# Patient Record
Sex: Female | Born: 1995 | Race: White | Hispanic: No | Marital: Single | Smoking: Never smoker
Health system: Southern US, Community
[De-identification: ages and names within clinical notes are randomized; demographics above are authoritative.]

## PROBLEM LIST (undated history)

## (undated) HISTORY — PX: APPENDECTOMY: SHX54

---

## 2022-06-04 ENCOUNTER — Emergency Department
Admission: EM | Admit: 2022-06-04 | Discharge: 2022-06-04 | Disposition: A | Discharge status: Home / Self Care | Payer: Self-pay | Attending: Emergency Medicine | Admitting: Emergency Medicine

## 2022-06-04 ENCOUNTER — Encounter: Payer: Self-pay | Admitting: Emergency Medicine

## 2022-06-04 ENCOUNTER — Emergency Department: Payer: Self-pay

## 2022-06-04 ENCOUNTER — Other Ambulatory Visit: Payer: Self-pay

## 2022-06-04 DIAGNOSIS — X501XXA Overexertion from prolonged static or awkward postures, initial encounter: Secondary | ICD-10-CM | POA: Insufficient documentation

## 2022-06-04 DIAGNOSIS — S93402A Sprain of unspecified ligament of left ankle, initial encounter: Secondary | ICD-10-CM | POA: Insufficient documentation

## 2022-06-04 DIAGNOSIS — Y9364 Activity, baseball: Secondary | ICD-10-CM | POA: Insufficient documentation

## 2022-06-04 MED ORDER — HYDROCODONE-ACETAMINOPHEN 5-325 MG PO TABS: 1.0000 | ORAL_TABLET | Freq: Four times a day (QID) | ORAL | 0 refills | Status: AC | PRN

## 2022-06-04 MED ORDER — HYDROCODONE-ACETAMINOPHEN 5-325 MG PO TABS
1.0000 | ORAL_TABLET | Freq: Once | ORAL | Status: AC
  Administered 2022-06-04: 1 via ORAL
  Filled 2022-06-04: qty 1

## 2022-06-04 NOTE — Discharge Instructions (Addendum)
Follow-up with orthopedist of your choice when you get back home if any continued problems.  Ice and elevation to reduce swelling and help with pain.  Also use your crutches until you are able to bear weight without any pain.  Wear the ankle splint for support and protection.  A prescription for pain medication was sent to your pharmacy to take as needed.  Do not drive or operate machinery while taking this medication as it could cause drowsiness and increase your risk for fall.

## 2022-06-04 NOTE — ED Notes (Signed)
See triage note  Presents with injury to left ankle  States hurt her ankle playing softball  Jumped and landed wrong

## 2022-06-04 NOTE — ED Triage Notes (Signed)
Pt via POV from home. Pt was playing softball and jumped up and twisted her L ankle today. Pt is A&OX4 and NAD. Unable to bear weight.

## 2022-06-04 NOTE — ED Provider Notes (Signed)
[facility] Provider Note    Event Date/Time   First MD Initiated Contact with Patient 06/04/22 1222     (approximate)   History   Ankle Pain   HPI  Jo Gonzales is a 26 y.o. female   presents to the ED with complaint of left ankle pain.  Patient states she was playing softball and jumped up landing on her ankle wrong and heard a couple of pops.  Since that time she is been unable to bear weight without pain and has some swelling.  Patient denies any other symptoms or injuries.      Physical Exam   Triage Vital Signs: ED Triage Vitals  Enc Vitals Group     BP 06/04/22 1204 116/80     Pulse Rate 06/04/22 1204 70     Resp 06/04/22 1204 18     Temp 06/04/22 1204 98.5 F (36.9 C)     Temp Source 06/04/22 1204 Oral     SpO2 06/04/22 1204 98 %     Weight 06/04/22 1201 190 lb (86.2 kg)     Height 06/04/22 1201 5\' 9"  (1.753 m)     Head Circumference --      Peak Flow --      Pain Score 06/04/22 1201 9     Pain Loc --      Pain Edu? --      Excl. in GC? --     Most recent vital signs: Vitals:   06/04/22 1204  BP: 116/80  Pulse: 70  Resp: 18  Temp: 98.5 F (36.9 C)  SpO2: 98%     General: Awake, no distress.  CV:  Good peripheral perfusion.  Resp:  Normal effort.  Abd:  No distention.  Other:  Examination of left ankle there is some soft tissue edema noted on the lateral malleolus but no gross deformity is noted.  Pulses present and motor or sensory function intact.  Skin is intact no discoloration at this time.   ED Results / Procedures / Treatments   Labs (all labs ordered are listed, but only abnormal results are displayed) Labs Reviewed - No data to display   RADIOLOGY Images of the left ankle were reviewed and interpreted by me with no fracture noted.  Radiology report is negative for acute bony injury.    PROCEDURES:  Critical Care performed:   Procedures   MEDICATIONS ORDERED IN ED: Medications   HYDROcodone-acetaminophen (NORCO/VICODIN) 5-325 MG per tablet 1 tablet (has no administration in time range)     IMPRESSION / MDM / ASSESSMENT AND PLAN / ED COURSE  I reviewed the triage vital signs and the nursing notes.   Differential diagnosis includes, but is not limited to, fractured left ankle, sprain left ankle, dislocation.  26 year old female presents to the ED with an injury to her left ankle while playing softball today.  Patient has some swelling and has been unable to bear weight since that time.  X-rays were reassuring and negative for any acute bony injury.  Patient was made aware that she would need to use crutches for walking which she has at home.  A ankle splint was applied and patient received Norco while in the emergency department for pain.  Patient is instructed to ice and elevate as needed and to use her crutches until she is able to bear weight without pain.  She is to follow-up with her orthopedist or PCP when she gets back home to [facility].  Patient's presentation is most consistent with acute complicated illness / injury requiring diagnostic workup.  FINAL CLINICAL IMPRESSION(S) / ED DIAGNOSES   Final diagnoses:  Sprain of left ankle, unspecified ligament, initial encounter     Rx / DC Orders   ED Discharge Orders          Ordered    HYDROcodone-acetaminophen (NORCO/VICODIN) 5-325 MG tablet  Every 6 hours PRN        06/04/22 1342             Note:  This document was prepared using Dragon voice recognition software and may include unintentional dictation errors.   Tommi Rumps, PA-C 06/04/22 1350    Sharman Cheek, MD 06/04/22 1934

## 2023-07-10 IMAGING — DX DG ANKLE COMPLETE 3+V*L*
3 series · 3 of 3 positions shown · non-contrast
Comparison: None Available.

CLINICAL DATA: Ankle injury.  Unable to bear weight.

EXAM:
LEFT ANKLE COMPLETE - 3+ VIEW

[ankle ap]
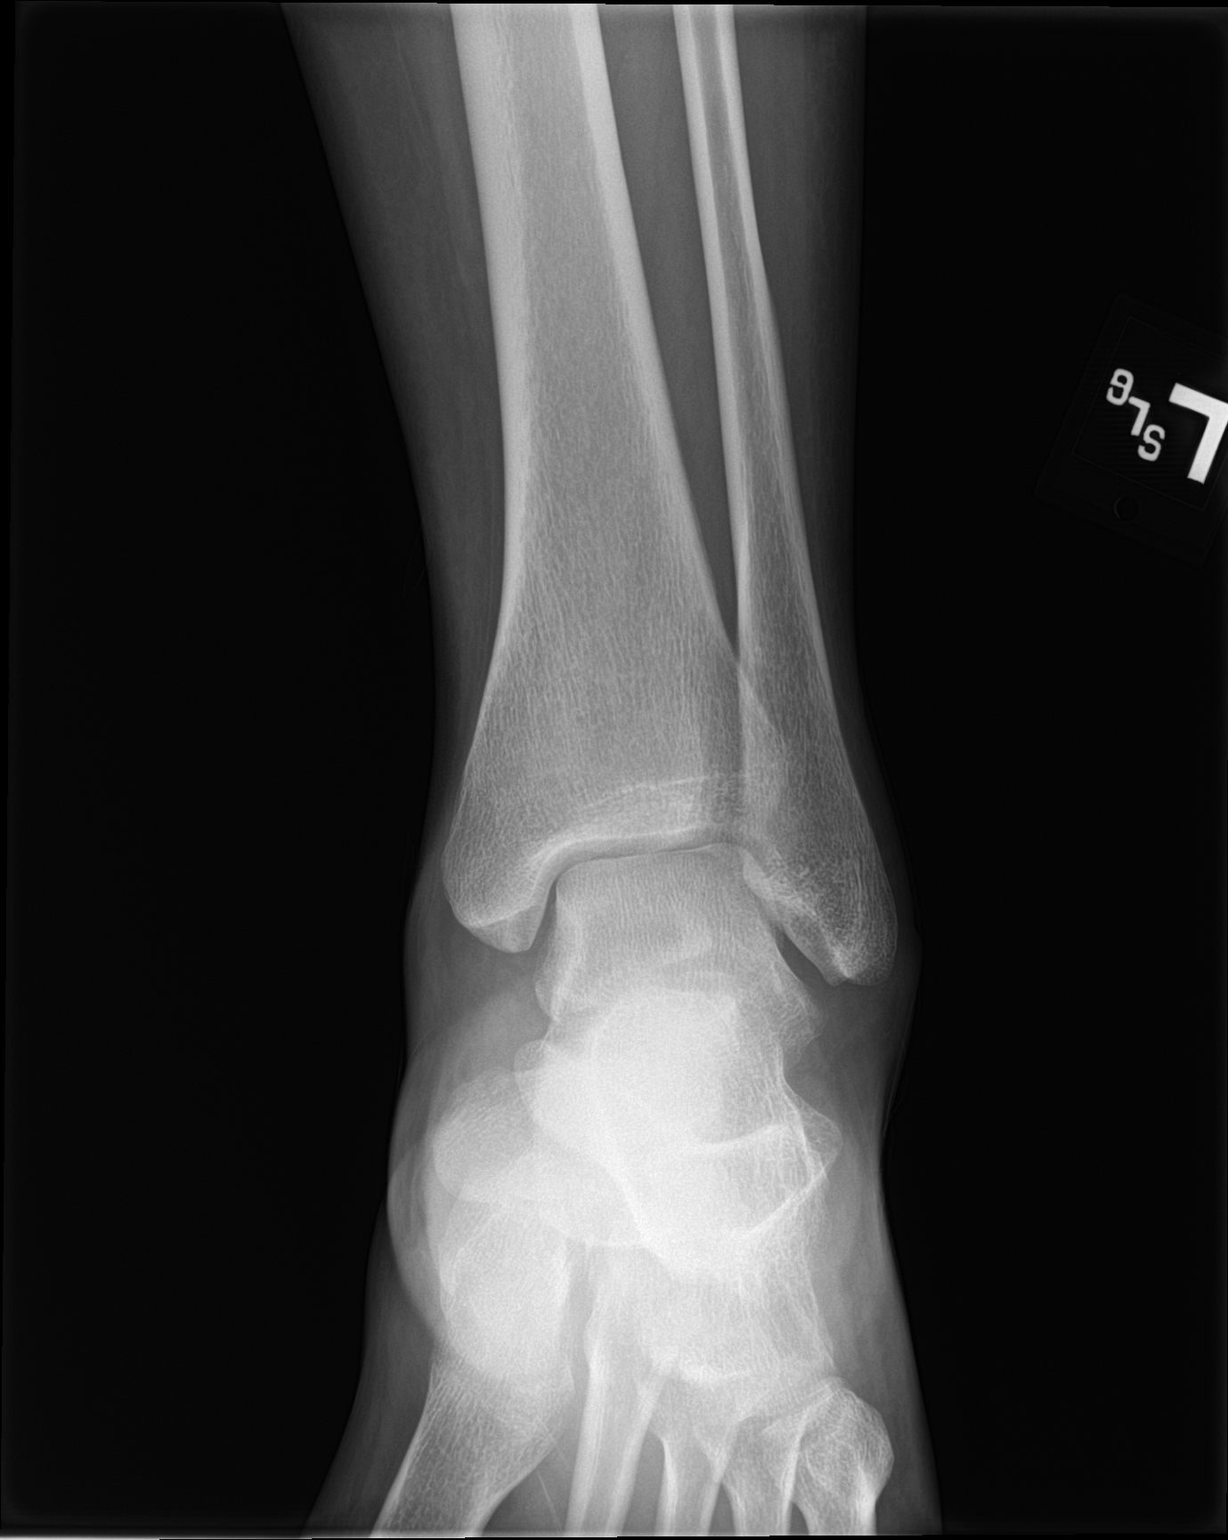

[ankle obl]
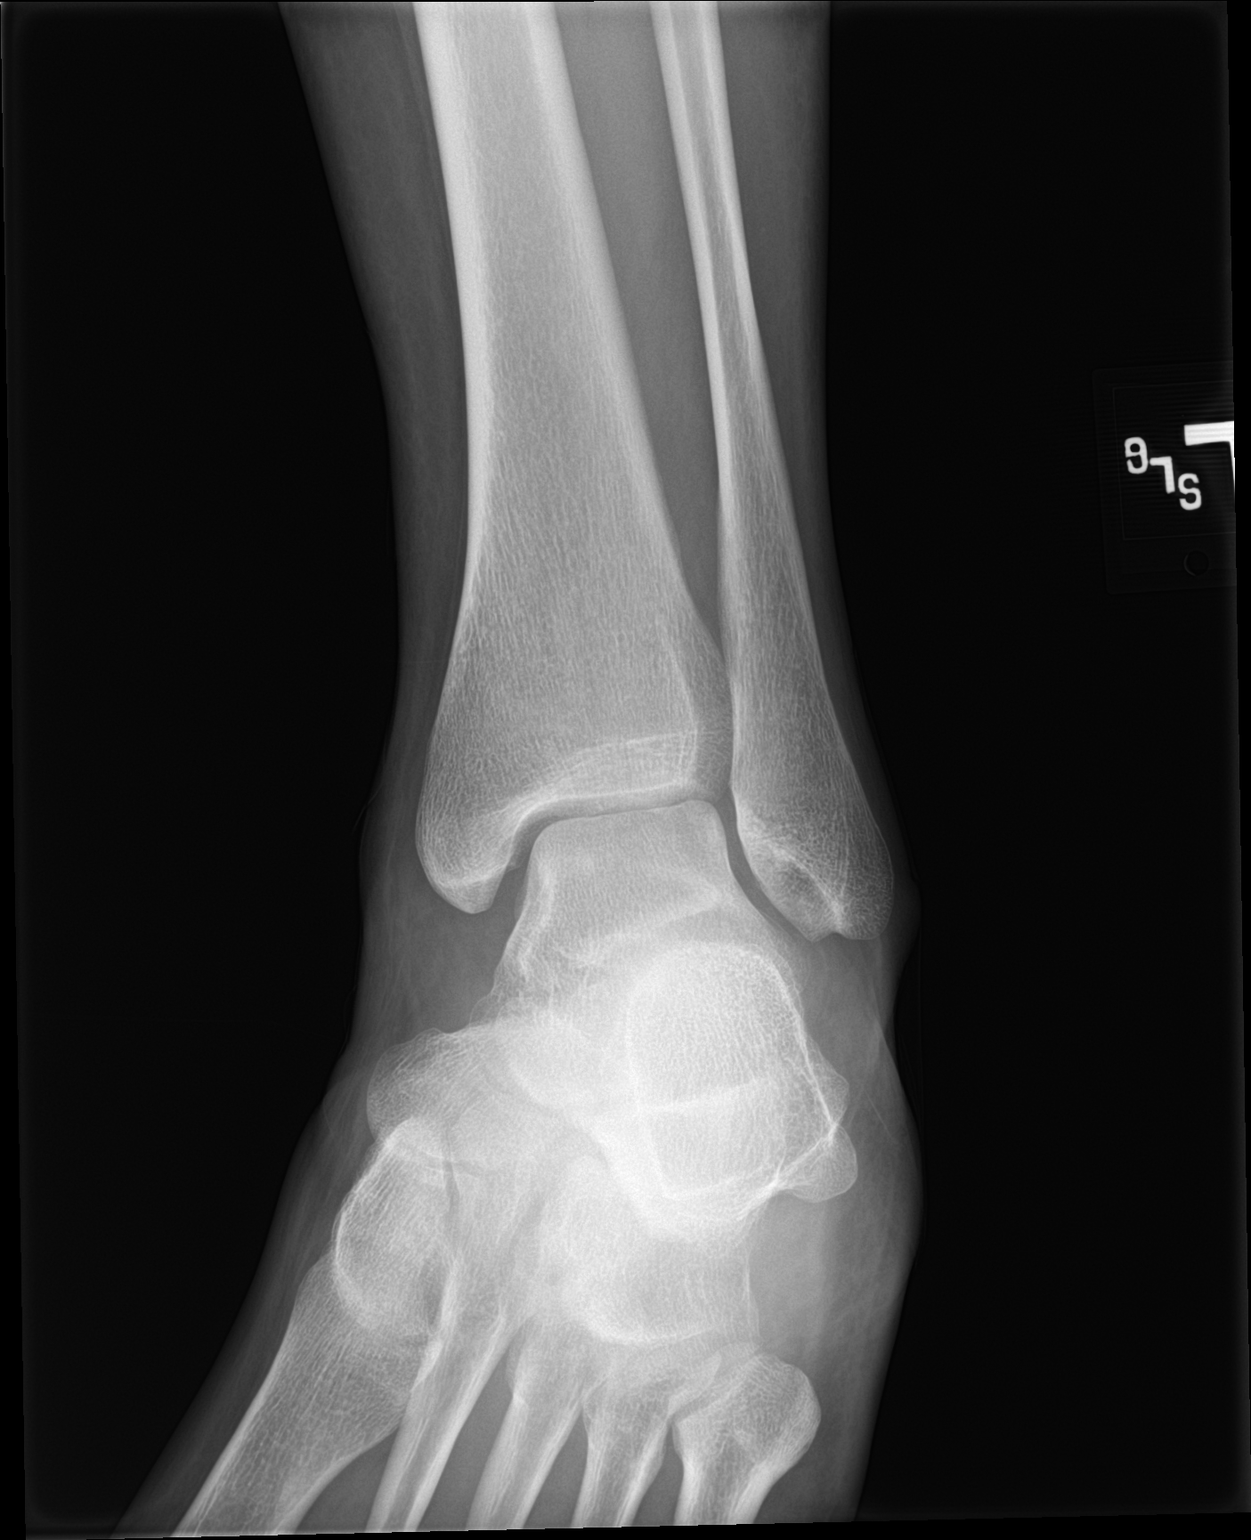

[ankle lat]
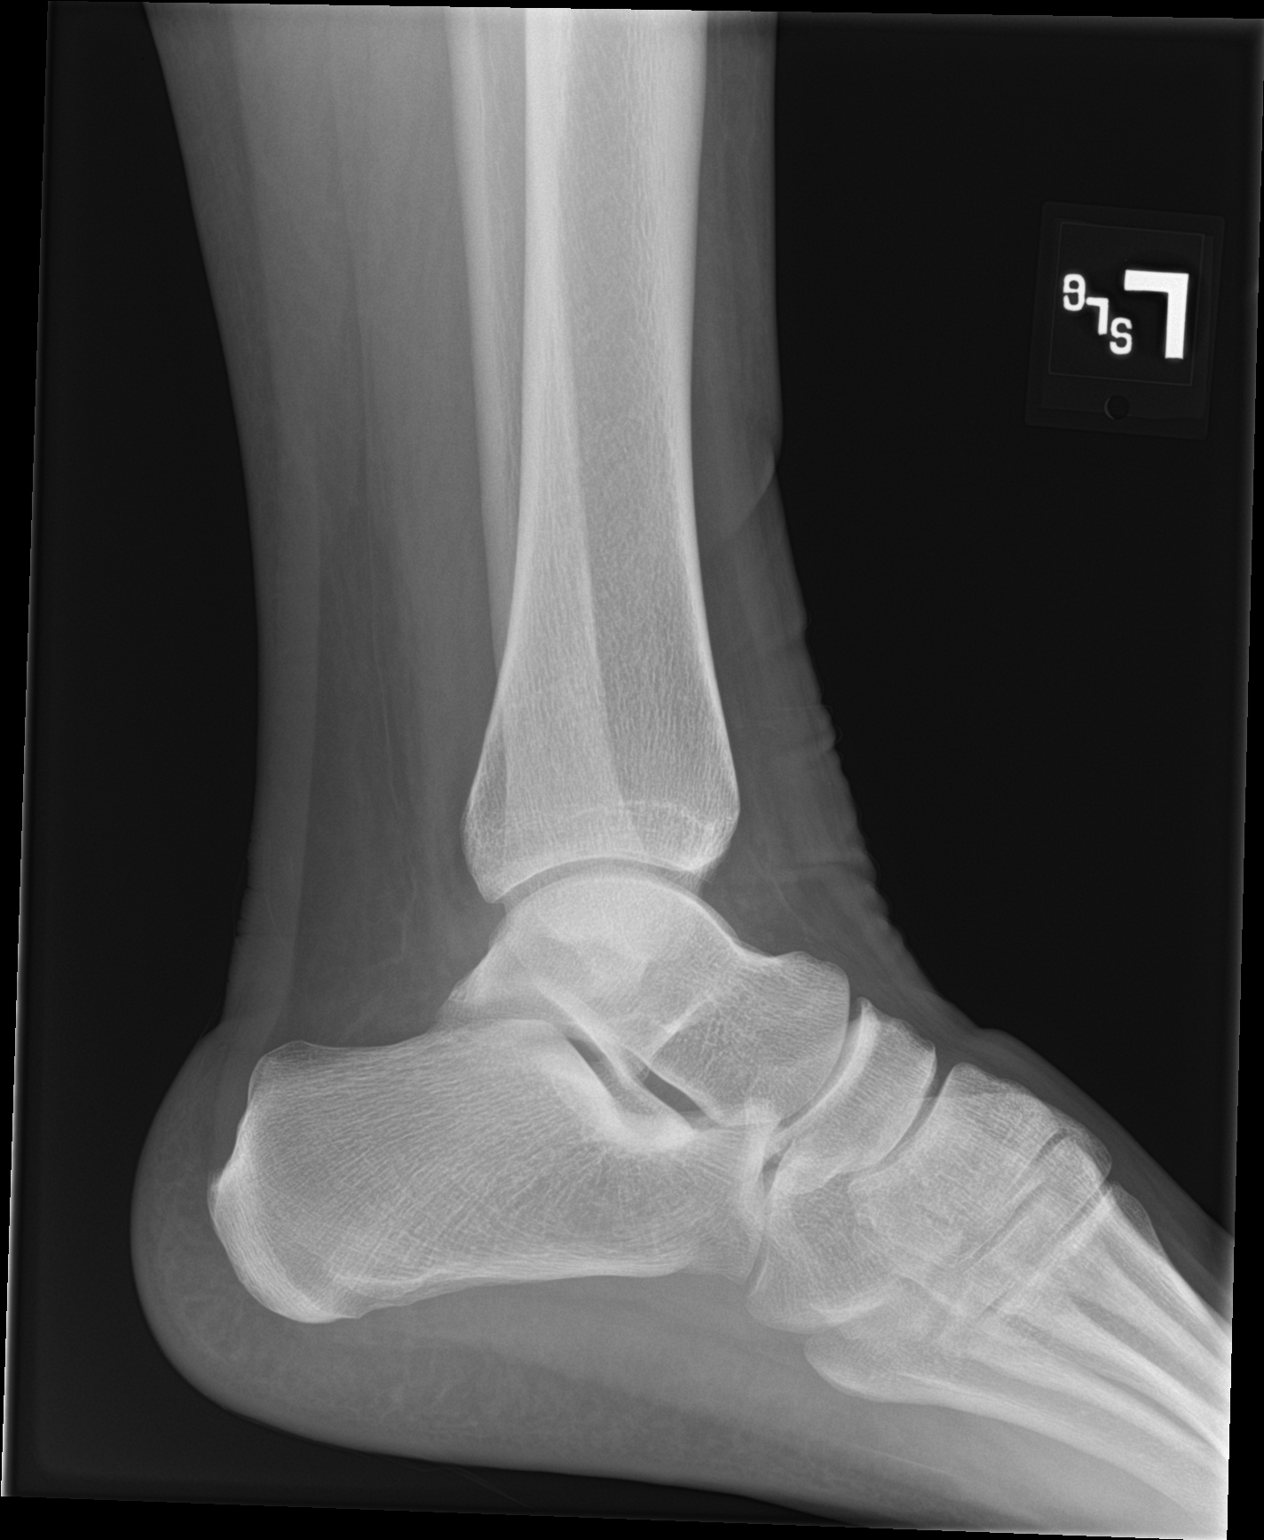

[3 of 3 positions shown; findings below may reference images not displayed]

FINDINGS: Mild soft tissue swelling. There is no evidence of fracture,
dislocation, or joint effusion. There is no evidence of arthropathy
or other focal bone abnormality.
IMPRESSION: Soft tissue swelling.  No acute osseous findings.
